# Patient Record
Sex: Male | Born: 1995 | Race: White | Hispanic: No | Marital: Single | Smoking: Never smoker
Health system: Southern US, Community
[De-identification: ages and names within clinical notes are randomized; demographics above are authoritative.]

## PROBLEM LIST (undated history)

## (undated) DIAGNOSIS — Z789 Other specified health status: Secondary | ICD-10-CM

---
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
## 2018-02-25 ENCOUNTER — Encounter (HOSPITAL_BASED_OUTPATIENT_CLINIC_OR_DEPARTMENT_OTHER): Payer: Self-pay | Admitting: *Deleted

## 2018-02-25 ENCOUNTER — Other Ambulatory Visit: Payer: Self-pay

## 2018-02-25 ENCOUNTER — Other Ambulatory Visit: Payer: Self-pay | Admitting: Orthopedic Surgery

## 2018-02-26 ENCOUNTER — Other Ambulatory Visit: Payer: Self-pay

## 2018-02-26 ENCOUNTER — Encounter (HOSPITAL_BASED_OUTPATIENT_CLINIC_OR_DEPARTMENT_OTHER): Payer: Self-pay | Admitting: *Deleted

## 2018-02-26 ENCOUNTER — Ambulatory Visit (HOSPITAL_BASED_OUTPATIENT_CLINIC_OR_DEPARTMENT_OTHER): Payer: 59 | Admitting: Anesthesiology

## 2018-02-26 ENCOUNTER — Ambulatory Visit (HOSPITAL_BASED_OUTPATIENT_CLINIC_OR_DEPARTMENT_OTHER)
Admission: RE | Admit: 2018-02-26 | Discharge: 2018-02-26 | Disposition: A | Discharge status: Home / Self Care | Payer: 59 | Source: Ambulatory Visit | Attending: Orthopedic Surgery | Admitting: Orthopedic Surgery

## 2018-02-26 ENCOUNTER — Encounter (HOSPITAL_BASED_OUTPATIENT_CLINIC_OR_DEPARTMENT_OTHER)
Admission: RE | Disposition: A | Discharge status: Home / Self Care | Payer: Self-pay | Source: Ambulatory Visit | Attending: Orthopedic Surgery

## 2018-02-26 DIAGNOSIS — W500XXA Accidental hit or strike by another person, initial encounter: Secondary | ICD-10-CM | POA: Insufficient documentation

## 2018-02-26 DIAGNOSIS — Y9366 Activity, soccer: Secondary | ICD-10-CM | POA: Insufficient documentation

## 2018-02-26 DIAGNOSIS — S62221A Displaced Rolando's fracture, right hand, initial encounter for closed fracture: Secondary | ICD-10-CM | POA: Diagnosis present

## 2018-02-26 HISTORY — PX: CLOSED REDUCTION FINGER WITH PERCUTANEOUS PINNING: SHX5612

## 2018-02-26 HISTORY — DX: Other specified health status: Z78.9

## 2018-02-26 SURGERY — CLOSED REDUCTION, FINGER, WITH PERCUTANEOUS PINNING
Anesthesia: Monitor Anesthesia Care | Site: Thumb | Laterality: Right

## 2018-02-26 MED ORDER — FENTANYL CITRATE (PF) 100 MCG/2ML IJ SOLN
50.0000 ug | INTRAMUSCULAR | Status: DC | PRN
  Administered 2018-02-26 (×2): 100 ug via INTRAVENOUS

## 2018-02-26 MED ORDER — MIDAZOLAM HCL 2 MG/2ML IJ SOLN
INTRAMUSCULAR | Status: AC
  Filled 2018-02-26: qty 2

## 2018-02-26 MED ORDER — HYDROCODONE-ACETAMINOPHEN 5-325 MG PO TABS: ORAL_TABLET | ORAL | 0 refills | Status: DC

## 2018-02-26 MED ORDER — OXYCODONE HCL 5 MG/5ML PO SOLN: 5.0000 mg | Freq: Once | ORAL | Status: DC | PRN

## 2018-02-26 MED ORDER — CHLORHEXIDINE GLUCONATE 4 % EX LIQD: 60.0000 mL | Freq: Once | CUTANEOUS | Status: DC

## 2018-02-26 MED ORDER — HYDROMORPHONE HCL 1 MG/ML IJ SOLN: 0.2500 mg | INTRAMUSCULAR | Status: DC | PRN

## 2018-02-26 MED ORDER — MIDAZOLAM HCL 2 MG/2ML IJ SOLN
1.0000 mg | INTRAMUSCULAR | Status: DC | PRN
  Administered 2018-02-26 (×2): 2 mg via INTRAVENOUS

## 2018-02-26 MED ORDER — CEFAZOLIN SODIUM-DEXTROSE 2-4 GM/100ML-% IV SOLN
INTRAVENOUS | Status: AC
Start: 2018-02-26 — End: ?
  Filled 2018-02-26: qty 100

## 2018-02-26 MED ORDER — PROMETHAZINE HCL 25 MG/ML IJ SOLN: 6.2500 mg | INTRAMUSCULAR | Status: DC | PRN

## 2018-02-26 MED ORDER — OXYCODONE HCL 5 MG PO TABS: 5.0000 mg | ORAL_TABLET | Freq: Once | ORAL | Status: DC | PRN

## 2018-02-26 MED ORDER — LACTATED RINGERS IV SOLN
INTRAVENOUS | Status: DC
  Administered 2018-02-26: 11:00:00 via INTRAVENOUS

## 2018-02-26 MED ORDER — PROPOFOL 500 MG/50ML IV EMUL
INTRAVENOUS | Status: DC | PRN
  Administered 2018-02-26: 120 ug/kg/min via INTRAVENOUS

## 2018-02-26 MED ORDER — CEFAZOLIN SODIUM-DEXTROSE 2-4 GM/100ML-% IV SOLN
2.0000 g | INTRAVENOUS | Status: AC
  Administered 2018-02-26: 2 g via INTRAVENOUS

## 2018-02-26 MED ORDER — ROPIVACAINE HCL 5 MG/ML IJ SOLN
INTRAMUSCULAR | Status: DC | PRN
  Administered 2018-02-26: 30 mL via PERINEURAL

## 2018-02-26 MED ORDER — FENTANYL CITRATE (PF) 100 MCG/2ML IJ SOLN
INTRAMUSCULAR | Status: AC
  Filled 2018-02-26: qty 2

## 2018-02-26 MED ORDER — SCOPOLAMINE 1 MG/3DAYS TD PT72: 1.0000 | MEDICATED_PATCH | Freq: Once | TRANSDERMAL | Status: DC | PRN

## 2018-02-26 MED ORDER — OXYCODONE-ACETAMINOPHEN 5-325 MG PO TABS: ORAL_TABLET | ORAL | 0 refills | Status: AC

## 2018-02-26 MED ORDER — MEPERIDINE HCL 25 MG/ML IJ SOLN: 6.2500 mg | INTRAMUSCULAR | Status: DC | PRN

## 2018-02-26 SURGICAL SUPPLY — 42 items
BANDAGE ACE 3X5.8 VEL STRL LF (GAUZE/BANDAGES/DRESSINGS) ×2 IMPLANT
BLADE SURG 15 STRL LF DISP TIS (BLADE) ×2 IMPLANT
BLADE SURG 15 STRL SS (BLADE) ×2
BNDG ELASTIC 2X5.8 VLCR STR LF (GAUZE/BANDAGES/DRESSINGS) IMPLANT
BNDG ESMARK 4X9 LF (GAUZE/BANDAGES/DRESSINGS) ×2 IMPLANT
BNDG GAUZE ELAST 4 BULKY (GAUZE/BANDAGES/DRESSINGS) ×2 IMPLANT
CHLORAPREP W/TINT 26ML (MISCELLANEOUS) ×2 IMPLANT
CORD BIPOLAR FORCEPS 12FT (ELECTRODE) IMPLANT
COVER BACK TABLE 60X90IN (DRAPES) ×2 IMPLANT
COVER MAYO STAND STRL (DRAPES) ×2 IMPLANT
CUFF TOURNIQUET SINGLE 18IN (TOURNIQUET CUFF) ×2 IMPLANT
DRAPE EXTREMITY T 121X128X90 (DRAPE) ×2 IMPLANT
DRAPE OEC MINIVIEW 54X84 (DRAPES) ×2 IMPLANT
DRAPE SURG 17X23 STRL (DRAPES) ×2 IMPLANT
GAUZE SPONGE 4X4 12PLY STRL (GAUZE/BANDAGES/DRESSINGS) ×2 IMPLANT
GAUZE XEROFORM 1X8 LF (GAUZE/BANDAGES/DRESSINGS) ×2 IMPLANT
GLOVE BIO SURGEON STRL SZ7 (GLOVE) ×2 IMPLANT
GLOVE BIO SURGEON STRL SZ7.5 (GLOVE) ×2 IMPLANT
GLOVE BIO SURGEON STRL SZ8 (GLOVE) ×2 IMPLANT
GLOVE BIOGEL PI IND STRL 8 (GLOVE) ×2 IMPLANT
GLOVE BIOGEL PI IND STRL 8.5 (GLOVE) ×1 IMPLANT
GLOVE BIOGEL PI INDICATOR 8 (GLOVE) ×2
GLOVE BIOGEL PI INDICATOR 8.5 (GLOVE) ×1
GLOVE SURG ORTHO 8.0 STRL STRW (GLOVE) IMPLANT
GOWN STRL REUS W/ TWL LRG LVL3 (GOWN DISPOSABLE) ×1 IMPLANT
GOWN STRL REUS W/TWL LRG LVL3 (GOWN DISPOSABLE) ×1
NEEDLE HYPO 25X1 1.5 SAFETY (NEEDLE) IMPLANT
NS IRRIG 1000ML POUR BTL (IV SOLUTION) ×2 IMPLANT
PACK BASIN DAY SURGERY FS (CUSTOM PROCEDURE TRAY) ×2 IMPLANT
PAD CAST 3X4 CTTN HI CHSV (CAST SUPPLIES) IMPLANT
PAD CAST 4YDX4 CTTN HI CHSV (CAST SUPPLIES) IMPLANT
PADDING CAST COTTON 3X4 STRL (CAST SUPPLIES)
PADDING CAST COTTON 4X4 STRL (CAST SUPPLIES)
SLEEVE SCD COMPRESS KNEE MED (MISCELLANEOUS) IMPLANT
SLING ARM IMMOBILIZER LRG (SOFTGOODS) ×2 IMPLANT
STOCKINETTE 4X48 STRL (DRAPES) ×2 IMPLANT
SUT ETHILON 3 0 PS 1 (SUTURE) IMPLANT
SUT ETHILON 4 0 PS 2 18 (SUTURE) IMPLANT
SYR BULB 3OZ (MISCELLANEOUS) IMPLANT
SYR CONTROL 10ML LL (SYRINGE) IMPLANT
TOWEL GREEN STERILE FF (TOWEL DISPOSABLE) ×4 IMPLANT
UNDERPAD 30X30 (UNDERPADS AND DIAPERS) ×2 IMPLANT

## 2018-02-26 NOTE — Discharge Instructions (Addendum)

## 2018-02-26 NOTE — Anesthesia Preprocedure Evaluation (Signed)
Anesthesia Evaluation  Patient identified by MRN, date of birth, ID band Patient awake    Reviewed: Allergy & Precautions, NPO status , Patient's Chart, lab work & pertinent test results  Airway Mallampati: II  TM Distance: >3 FB Neck ROM: Full    Dental no notable dental hx.    Pulmonary neg pulmonary ROS,    Pulmonary exam normal breath sounds clear to auscultation       Cardiovascular negative cardio ROS Normal cardiovascular exam Rhythm:Regular Rate:Normal     Neuro/Psych negative neurological ROS  negative psych ROS   GI/Hepatic negative GI ROS, Neg liver ROS,   Endo/Other  negative endocrine ROS  Renal/GU negative Renal ROS  negative genitourinary   Musculoskeletal negative musculoskeletal ROS (+)   Abdominal   Peds negative pediatric ROS (+)  Hematology negative hematology ROS (+)   Anesthesia Other Findings   Reproductive/Obstetrics negative OB ROS                             Anesthesia Physical Anesthesia Plan  ASA: I  Anesthesia Plan: MAC and Regional   Post-op Pain Management:    Induction: Intravenous  PONV Risk Score and Plan: 1 and Ondansetron  Airway Management Planned: Simple Face Mask  Additional Equipment:   Intra-op Plan:   Post-operative Plan:   Informed Consent: I have reviewed the patients History and Physical, chart, labs and discussed the procedure including the risks, benefits and alternatives for the proposed anesthesia with the patient or authorized representative who has indicated his/her understanding and acceptance.   Dental advisory given  Plan Discussed with: CRNA  Anesthesia Plan Comments:         Anesthesia Quick Evaluation

## 2018-02-26 NOTE — H&P (Signed)
  Adam Lutz is an 22 y.o. male.   Chief Complaint: right metacarpal fracture HPI: 22 yo male states he injured right hand when stepped on playing soccer.  Seen in UC where XR revealed thumb metacarpal base fracture.  Splinted and followed up in office.  He wishes to proceed with operative fixation.  Allergies: No Known Allergies  Past Medical History:  Diagnosis Date  . Medical history non-contributory     Past Surgical History:  Procedure Laterality Date  . FOOT SURGERY  2011    Family History: History reviewed. No pertinent family history.  Social History:   reports that he has never smoked. He has never used smokeless tobacco. He reports that he does not drink alcohol or use drugs.  Medications: No medications prior to admission.    No results found for this or any previous visit (from the past 48 hour(s)).  No results found.   A comprehensive review of systems was negative.  Height 6\' 4"  (1.93 m), weight 86.2 kg (190 lb).  General appearance: alert, cooperative and appears stated age Head: Normocephalic, without obvious abnormality, atraumatic Neck: supple, symmetrical, trachea midline Cardio: regular rate and rhythm Resp: clear to auscultation bilaterally Extremities: Intact sensation and capillary refill all digits.  +epl/fpl/io.  No wounds.  Pulses: 2+ and symmetric Skin: Skin color, texture, turgor normal. No rashes or lesions Neurologic: Grossly normal Incision/Wound: none  Assessment/Plan Right thumb metacarpal base fracture.  Non operative and operative treatment options were discussed with the patient and patient wishes to proceed with operative treatment. Risks, benefits, and alternatives of surgery were discussed and the patient agrees with the plan of care.   Lanah Steines R 02/26/2018, 11:14 AM

## 2018-02-26 NOTE — Op Note (Signed)
NAME: Darol Cush MEDICAL RECORD NO: [id number] DATE OF BIRTH: Jul 06, 1996 FACILITY: [facility] LOCATION: [facility] PHYSICIAN: Tami Ribas, MD   OPERATIVE REPORT   DATE OF PROCEDURE: 02/26/18    PREOPERATIVE DIAGNOSIS:   Right thumb Rolando's fracture   POSTOPERATIVE DIAGNOSIS:   Right thumb Rolando's fracture   PROCEDURE:   Closed reduction percutaneous pinning right thumb Rolando fracture   SURGEON:  Betha Loa, M.D.   ASSISTANT: none Cindee Salt, MD   ANESTHESIA:  General with regional   INTRAVENOUS FLUIDS:  Per anesthesia flow sheet.   ESTIMATED BLOOD LOSS:  Minimal.   COMPLICATIONS:  None.   SPECIMENS:  none   TOURNIQUET TIME:    Total Tourniquet Time Documented: Upper Arm (Right) - 13 minutes Total: Upper Arm (Right) - 13 minutes    DISPOSITION:  Stable to PACU.   INDICATIONS: 22 year old male states he injured his right hand while playing soccer when he was stepped on.  Radiographs are taken revealing a fracture at the base of the thumb metacarpal with displacement.  He wishes to proceed with operative fixation. Risks, benefits and alternatives of surgery were discussed including the risks of blood loss, infection, damage to nerves, vessels, tendons, ligaments, bone for surgery, need for additional surgery, complications with wound healing, continued pain, nonunion, malunion, stiffness.  He voiced understanding of these risks and elected to proceed.  OPERATIVE COURSE:  After being identified preoperatively by myself,  the patient and I agreed on the procedure and site of the procedure.  The surgical site was marked.  Surgical consent had been signed. He was given IV Ancef as preoperative antibiotic prophylaxis. He was transferred to the operating room and placed on the operating table in supine position with the Right upper extremity on an arm board.  General anesthesia was induced by the anesthesiologist. A regional block had been performed by  anesthesia in preoperative holding.   Right upper extremity was prepped and draped in normal sterile orthopedic fashion.  A surgical pause was performed between the surgeons, anesthesia, and operating room staff and all were in agreement as to the patient, procedure, and site of procedure.  Tourniquet at the proximal aspect of the extremity was inflated to 250 mmHg after exsanguination of the arm with an Esmarch bandage.    C-arm was used in AP lateral and oblique views throughout the case.  A closed reduction of the right thumb metacarpal base was performed.  Three 0.045 K wires were then used.  One was advanced across the base of the thumb metacarpal into the index finger metacarpal wound was advanced obliquely across the fracture site into the carpus and 1 advanced distal to the fracture site across the thumb metacarpal and into the index finger metacarpal.  This provided good stabilization of the reduced fracture.  The pins were bent and cut short.  The wounds were dressed with sterile Xeroform 4 x 4 and wrapped with a Kerlix bandage.  Thumb spica splint was placed and wrapped with Kerlix and Ace bandage.  The tourniquet was deflated at 13 minutes.  Fingertips were pink with brisk capillary refill after deflation of tourniquet.  The operative  drapes were broken down.  The patient was awoken from anesthesia safely.  He was transferred back to the stretcher and taken to PACU in stable condition.  I will see him back in the office in 1 week for postoperative followup.  I will give him a prescription for Norco 5/325 1-2 tabs PO q6  hours prn pain, dispense # 20.   Tami RibasKUZMA,Alayne Estrella R, MD Electronically signed, 02/26/18

## 2018-02-26 NOTE — Anesthesia Postprocedure Evaluation (Signed)
Anesthesia Post Note  Patient: Mattheus Rauls  Procedure(s) Performed: CLOSED REDUCTION OERCUTANEOUS PINNING RIGHT THUMB METACARPAL FRACTURE (Right Thumb)     Patient location during evaluation: PACU Anesthesia Type: Regional and MAC Level of consciousness: awake and alert Pain management: pain level controlled Vital Signs Assessment: post-procedure vital signs reviewed and stable Respiratory status: spontaneous breathing, nonlabored ventilation and respiratory function stable Cardiovascular status: stable and blood pressure returned to baseline Postop Assessment: no apparent nausea or vomiting Anesthetic complications: no    Last Vitals:  Vitals:   02/26/18 1430 02/26/18 1515  BP: 122/85 132/79  Pulse: (!) 58 (!) 53  Resp: 11 20  Temp:  37 C  SpO2: 100% 100%    Last Pain:  Vitals:   02/26/18 1515  TempSrc:   PainSc: 0-No pain                 Lynda Rainwater

## 2018-02-26 NOTE — Op Note (Signed)
I assisted Surgeon(s) and Role:    * Betha LoaKuzma, Kevin, MD - Primary on the Procedure(s): CLOSED REDUCTION OERCUTANEOUS PINNING RIGHT THUMB METACARPAL FRACTURE on 02/26/2018.  I provided assistance on this case as follows: setup, reduction, stabilization and fixation of the fracture, wit application of the plints. Electronically signed by: Nicki ReaperKUZMA,Tanaja Ganger R, MD Date: 02/26/2018 Time: 2:00 PM

## 2018-02-26 NOTE — Anesthesia Procedure Notes (Signed)
Anesthesia Regional Block: Supraclavicular block   Pre-Anesthetic Checklist: ,, timeout performed, Correct Patient, Correct Site, Correct Laterality, Correct Procedure, Correct Position, site marked, Risks and benefits discussed,  Surgical consent,  Pre-op evaluation,  At surgeon's request and post-op pain management  Laterality: Right  Prep: chloraprep       Needles:  Injection technique: Single-shot  Needle Type: Stimiplex     Needle Length: 9cm  Needle Gauge: 21     Additional Needles:   Narrative:  Start time: 02/26/2018 12:51 PM End time: 02/26/2018 12:56 PM Injection made incrementally with aspirations every 5 mL.  Performed by: Personally  Anesthesiologist: Lowella CurbMiller, Kimmarie Pascale Ray, MD

## 2018-02-26 NOTE — Progress Notes (Signed)
Assisted Dr. Miller with right, ultrasound guided, supraclavicular block. Side rails up, monitors on throughout procedure. See vital signs in flow sheet. Tolerated Procedure well. 

## 2018-02-26 NOTE — Transfer of Care (Signed)
Immediate Anesthesia Transfer of Care Note  Patient: Adam Lutz  Procedure(s) Performed: CLOSED REDUCTION OERCUTANEOUS PINNING RIGHT THUMB METACARPAL FRACTURE (Right Thumb)  Patient Location: PACU  Anesthesia Type:MAC and MAC combined with regional for post-op pain  Level of Consciousness: sedated  Airway & Oxygen Therapy: Patient Spontanous Breathing and Patient connected to face mask oxygen  Post-op Assessment: Report given to RN and Post -op Vital signs reviewed and stable  Post vital signs: Reviewed and stable  Last Vitals:  Vitals Value Taken Time  BP 96/59 02/26/2018  2:05 PM  Temp    Pulse 59 02/26/2018  2:06 PM  Resp 13 02/26/2018  2:06 PM  SpO2 99 % 02/26/2018  2:06 PM  Vitals shown include unvalidated device data.  Last Pain:  Vitals:   02/26/18 1119  TempSrc: Oral  PainSc: 7       Patients Stated Pain Goal: 3 (65/03/54 6568)  Complications: No apparent anesthesia complications

## 2018-02-27 ENCOUNTER — Encounter (HOSPITAL_BASED_OUTPATIENT_CLINIC_OR_DEPARTMENT_OTHER): Payer: Self-pay | Admitting: Orthopedic Surgery

## 2018-02-27 NOTE — Addendum Note (Signed)
Addendum  created 02/27/18 1441 by Cailey Trigueros, Jewel Baizeimothy D, CRNA   Charge Capture section accepted

## 2018-03-04 ENCOUNTER — Encounter (HOSPITAL_BASED_OUTPATIENT_CLINIC_OR_DEPARTMENT_OTHER): Payer: Self-pay | Admitting: Orthopedic Surgery

## 2019-10-29 ENCOUNTER — Ambulatory Visit: Payer: 59 | Attending: Internal Medicine

## 2019-10-29 DIAGNOSIS — Z23 Encounter for immunization: Secondary | ICD-10-CM

## 2019-10-29 NOTE — Progress Notes (Signed)
   Covid-19 Vaccination Clinic  Name:  Adam Lutz    MRN: [id number] DOB: 04-18-1996  10/29/2019  [name] was observed post Covid-19 immunization for 15 minutes without incident. He was provided with Vaccine Information Sheet and instruction to access the V-Safe system.   [name] was instructed to call 911 with any severe reactions post vaccine: Marland Kitchen Difficulty breathing  . Swelling of face and throat  . A fast heartbeat  . A bad rash all over body  . Dizziness and weakness   Immunizations Administered    Name Date Dose VIS Date Route   Pfizer COVID-19 Vaccine 10/29/2019 10:21 AM 0.3 mL 07/09/2019 Intramuscular   Manufacturer: [employer], [employer]   Lot: [id number]   NDC: [address]-2

## 2019-11-23 ENCOUNTER — Ambulatory Visit: Payer: 59 | Attending: Internal Medicine

## 2019-11-23 DIAGNOSIS — Z23 Encounter for immunization: Secondary | ICD-10-CM

## 2019-11-23 NOTE — Progress Notes (Signed)
   Covid-19 Vaccination Clinic  Name:  Adam Lutz    MRN: [id number] DOB: Aug 17, 1995  11/23/2019  [name] was observed post Covid-19 immunization for 15 minutes without incident. He was provided with Vaccine Information Sheet and instruction to access the V-Safe system.   [name] was instructed to call 911 with any severe reactions post vaccine: Marland Kitchen Difficulty breathing  . Swelling of face and throat  . A fast heartbeat  . A bad rash all over body  . Dizziness and weakness   Immunizations Administered    Name Date Dose VIS Date Route   Pfizer COVID-19 Vaccine 11/23/2019 11:27 AM 0.3 mL 09/22/2018 Intramuscular   Manufacturer: [employer], [employer]   Lot: [id number]   NDC: [address]-7

## 2023-10-26 ENCOUNTER — Emergency Department (HOSPITAL_BASED_OUTPATIENT_CLINIC_OR_DEPARTMENT_OTHER): Payer: MEDICAID

## 2023-10-26 ENCOUNTER — Emergency Department (HOSPITAL_BASED_OUTPATIENT_CLINIC_OR_DEPARTMENT_OTHER)
Admission: EM | Admit: 2023-10-26 | Discharge: 2023-10-26 | Disposition: A | Discharge status: Home / Self Care | Payer: MEDICAID | Attending: Emergency Medicine | Admitting: Emergency Medicine

## 2023-10-26 ENCOUNTER — Encounter (HOSPITAL_BASED_OUTPATIENT_CLINIC_OR_DEPARTMENT_OTHER): Payer: Self-pay

## 2023-10-26 ENCOUNTER — Other Ambulatory Visit: Payer: Self-pay

## 2023-10-26 DIAGNOSIS — X58XXXA Exposure to other specified factors, initial encounter: Secondary | ICD-10-CM | POA: Insufficient documentation

## 2023-10-26 DIAGNOSIS — Y9366 Activity, soccer: Secondary | ICD-10-CM | POA: Insufficient documentation

## 2023-10-26 DIAGNOSIS — S52501A Unspecified fracture of the lower end of right radius, initial encounter for closed fracture: Secondary | ICD-10-CM | POA: Insufficient documentation

## 2023-10-26 MED ORDER — KETOROLAC TROMETHAMINE 15 MG/ML IJ SOLN
15.0000 mg | Freq: Once | INTRAMUSCULAR | Status: AC
  Administered 2023-10-26: 15 mg via INTRAMUSCULAR
  Filled 2023-10-26: qty 1

## 2023-10-26 NOTE — ED Triage Notes (Signed)
 Pt c/o R wrist injury playing soccer. +ROM distal to injury. States he "jammed it, had an immediate 'oh no' feeling."

## 2023-10-26 NOTE — ED Provider Notes (Signed)
 [place] EMERGENCY DEPARTMENT AT [facility] Provider Note   CSN: [id number] Arrival date & time: 10/26/23  1845     History  Chief Complaint  Patient presents with   Wrist Injury    R    Armand Preast is a 28 y.o. male.   Wrist Injury  Patient is a 28 year old male presents ED today with right wrist pain and swelling after landing on outstretched hand while playing soccer saving the ball as a goalie today.  Denies numbness, weakness, tingling.    Home Medications Prior to Admission medications   Medication Sig Start Date End Date Taking? Authorizing Provider  ibuprofen (ADVIL,MOTRIN) 200 MG tablet Take 400 mg by mouth every 6 (six) hours as needed.    [provider]  oxyCODONE-acetaminophen (PERCOCET) 5-325 MG tablet 1-2 tabs po q6 hours prn pain 02/26/18   Betha Loa, MD      Allergies    Patient has no known allergies.    Review of Systems   Review of Systems  Musculoskeletal:  Positive for arthralgias and myalgias.  All other systems reviewed and are negative.   Physical Exam Updated Vital Signs BP 115/87 (BP Location: Right Arm)   Pulse 87   Temp 97.6 F (36.4 C)   Resp 18   Ht 6\' 4"  (1.93 m)   Wt 97.5 kg   SpO2 97%   BMI 26.17 kg/m  Physical Exam Vitals and nursing note reviewed.  Constitutional:      General: He is not in acute distress.    Appearance: Normal appearance. He is not ill-appearing.  HENT:     Head: Normocephalic and atraumatic.  Eyes:     Extraocular Movements: Extraocular movements intact.     Conjunctiva/sclera: Conjunctivae normal.  Cardiovascular:     Rate and Rhythm: Normal rate and regular rhythm.     Pulses: Normal pulses.     Heart sounds: Normal heart sounds. No murmur heard.    No friction rub. No gallop.  Pulmonary:     Effort: Pulmonary effort is normal. No respiratory distress.     Breath sounds: Normal breath sounds. No wheezing or rales.  Abdominal:     General: Abdomen is flat.      Palpations: Abdomen is soft.     Tenderness: There is no abdominal tenderness.  Musculoskeletal:        General: Swelling, tenderness, deformity and signs of injury present.     Cervical back: No rigidity.     Right lower leg: No edema.     Left lower leg: No edema.     Comments: Patient noted to have swelling tenderness over the distal radius.  Skin:    General: Skin is warm and dry.     Capillary Refill: Capillary refill takes less than 2 seconds.     Findings: No lesion.  Neurological:     General: No focal deficit present.     Mental Status: He is alert. Mental status is at baseline.     Sensory: No sensory deficit.     Motor: No weakness.     Gait: Gait normal.  Psychiatric:        Mood and Affect: Mood normal.     ED Results / Procedures / Treatments   Labs (all labs ordered are listed, but only abnormal results are displayed) Labs Reviewed - No data to display  EKG None  Radiology DG Wrist Complete Right Result Date: 10/26/2023 CLINICAL DATA:  injury EXAM: RIGHT WRIST -  COMPLETE 3+ VIEW COMPARISON:  None Available. FINDINGS: There is a nondisplaced mildly impacted fracture of the distal radius. There is associated soft tissue swelling. No additional fracture is noted. No unexpected radiopaque foreign body. No definitive intra-articular extension. IMPRESSION: Nondisplaced mildly impacted fracture of the distal radius. Electronically Signed   By: Meda Klinefelter M.D.   On: 10/26/2023 19:25    Procedures Procedures    Medications Ordered in ED Medications  ketorolac (TORADOL) 15 MG/ML injection 15 mg (15 mg Intramuscular Given 10/26/23 2005)    ED Course/ Medical Decision Making/ A&P                                 Medical Decision Making Amount and/or Complexity of Data Reviewed Radiology: ordered.   This patient is a 27 year old male who presents to the ED for concern of right wrist pain after a fall while playing soccer.  Patient noted to have some  swelling and tenderness over the radial aspect of his right arm distally.  On physical exam, patient is noted to have some tenderness and swelling present.  Fingers have full range of motion with normal cap refill, normal sensation.  X-ray was positive for a nondisplaced mildly.   Low suspicion for compartment syndrome, patient was splinted and patient provided Ortho submission to follow-up with them in the next day or 2 for evaluation.  Low suspicion for any other emergent pathology present this time.  Provide Toradol for pain.  Recommend he continue with symptomatic management at home.  I believe patient safe to discharge at this time.  All questions answered.  Differential diagnoses prior to evaluation: Fracture, ligament injury, neurovascular injury, dislocation  Past Medical History / Social History / Additional history: Chart reviewed. Pertinent results include: No chronic past medical history  Medications / Treatment: Patient put in a radial gutter splint and provided Toradol for pain medication   Disposition: After consideration of the diagnostic results and the patients response to treatment, I feel that the patient bit from discharge return as above.   emergency department workup does not suggest an emergent condition requiring admission or immediate intervention beyond what has been performed at this time. The plan is: Splint, follow-up with Ortho, Tylenol and Profen and rest with elevation, return for new or worsening symptoms. The patient is safe for discharge and has been instructed to return immediately for worsening symptoms, change in symptoms or any other concerns.  Final Clinical Impression(s) / ED Diagnoses Final diagnoses:  Closed fracture of distal end of right radius, unspecified fracture morphology, initial encounter    Rx / DC Orders ED Discharge Orders     None         Lavonia Drafts 10/26/23 2055    Rolan Bucco, MD 10/26/23 2352

## 2023-10-26 NOTE — Discharge Instructions (Addendum)
 You were seen today for a radial fracture of the right wrist.  Recommend continue to keep this elevated and follow-up with orthopedics for further evaluation in the next day or 2.  I provided affirmation for which you are to call the office and schedule an appointment and/or go to their facility.  Be sure to take Tylenol and ibuprofen for pain relief.  Return for any new or worsening pain that is not controlled with Tylenol and ibuprofen.
